# Patient Record
Sex: Female | Born: 1995 | Race: White | Hispanic: No | Marital: Single | Smoking: Current every day smoker
Health system: Southern US, Community
[De-identification: ages and names within clinical notes are randomized; demographics above are authoritative.]

## PROBLEM LIST (undated history)

## (undated) DIAGNOSIS — Z915 Personal history of self-harm: Secondary | ICD-10-CM

## (undated) DIAGNOSIS — O009 Unspecified ectopic pregnancy without intrauterine pregnancy: Secondary | ICD-10-CM

## (undated) DIAGNOSIS — I82409 Acute embolism and thrombosis of unspecified deep veins of unspecified lower extremity: Secondary | ICD-10-CM

## (undated) DIAGNOSIS — F419 Anxiety disorder, unspecified: Secondary | ICD-10-CM

## (undated) DIAGNOSIS — G43909 Migraine, unspecified, not intractable, without status migrainosus: Secondary | ICD-10-CM

## (undated) HISTORY — DX: Personal history of self-harm: Z91.5

## (undated) HISTORY — PX: NEPHROSTOMY TUBE PLACEMENT (ARMC HX): HXRAD1726

## (undated) HISTORY — DX: Migraine, unspecified, not intractable, without status migrainosus: G43.909

---
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
## 2015-01-01 ENCOUNTER — Encounter: Payer: Self-pay | Admitting: Family Medicine

## 2015-01-01 ENCOUNTER — Ambulatory Visit (INDEPENDENT_AMBULATORY_CARE_PROVIDER_SITE_OTHER): Payer: Medicaid Other | Admitting: Family Medicine

## 2015-01-01 ENCOUNTER — Telehealth: Payer: Self-pay | Admitting: Family Medicine

## 2015-01-01 VITALS — BP 108/68 | HR 112 | Temp 98.1°F | Resp 20 | Wt 107.0 lb

## 2015-01-01 DIAGNOSIS — F39 Unspecified mood [affective] disorder: Secondary | ICD-10-CM

## 2015-01-01 DIAGNOSIS — F410 Panic disorder [episodic paroxysmal anxiety] without agoraphobia: Secondary | ICD-10-CM

## 2015-01-01 DIAGNOSIS — F419 Anxiety disorder, unspecified: Secondary | ICD-10-CM | POA: Diagnosis not present

## 2015-01-01 DIAGNOSIS — F418 Other specified anxiety disorders: Secondary | ICD-10-CM

## 2015-01-01 MED ORDER — DIAZEPAM 5 MG PO TABS: 2.5000 mg | ORAL_TABLET | Freq: Two times a day (BID) | ORAL | Status: AC | PRN
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
## 2015-01-01 NOTE — Telephone Encounter (Signed)
Please review
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
## 2015-01-05 ENCOUNTER — Emergency Department
Admission: EM | Admit: 2015-01-05 | Discharge: 2015-01-05 | Disposition: A | Discharge status: Other Acute Inpatient Hospital | Payer: Medicaid Other | Attending: Emergency Medicine | Admitting: Emergency Medicine

## 2015-01-05 ENCOUNTER — Encounter: Payer: Self-pay | Admitting: Emergency Medicine

## 2015-01-05 DIAGNOSIS — F1910 Other psychoactive substance abuse, uncomplicated: Secondary | ICD-10-CM | POA: Diagnosis present

## 2015-01-05 DIAGNOSIS — Z79899 Other long term (current) drug therapy: Secondary | ICD-10-CM | POA: Diagnosis not present

## 2015-01-05 DIAGNOSIS — F418 Other specified anxiety disorders: Secondary | ICD-10-CM | POA: Diagnosis present

## 2015-01-05 DIAGNOSIS — F4325 Adjustment disorder with mixed disturbance of emotions and conduct: Secondary | ICD-10-CM

## 2015-01-05 DIAGNOSIS — F17210 Nicotine dependence, cigarettes, uncomplicated: Secondary | ICD-10-CM | POA: Diagnosis not present

## 2015-01-05 DIAGNOSIS — R45851 Suicidal ideations: Secondary | ICD-10-CM

## 2015-01-05 DIAGNOSIS — F329 Major depressive disorder, single episode, unspecified: Secondary | ICD-10-CM | POA: Insufficient documentation

## 2015-01-05 DIAGNOSIS — F332 Major depressive disorder, recurrent severe without psychotic features: Secondary | ICD-10-CM | POA: Diagnosis present

## 2015-01-05 DIAGNOSIS — F32A Depression, unspecified: Secondary | ICD-10-CM

## 2015-01-05 LAB — COMPREHENSIVE METABOLIC PANEL
ALK PHOS: 53 U/L (ref 38–126)
ALT: 16 U/L (ref 14–54)
ANION GAP: 6 (ref 5–15)
AST: 19 U/L (ref 15–41)
Albumin: 4.6 g/dL (ref 3.5–5.0)
BUN: 19 mg/dL (ref 6–20)
CALCIUM: 9.1 mg/dL (ref 8.9–10.3)
CO2: 26 mmol/L (ref 22–32)
Chloride: 108 mmol/L (ref 101–111)
Creatinine, Ser: 0.81 mg/dL (ref 0.44–1.00)
Glucose, Bld: 92 mg/dL (ref 65–99)
Potassium: 3.6 mmol/L (ref 3.5–5.1)
SODIUM: 140 mmol/L (ref 135–145)
Total Bilirubin: 0.5 mg/dL (ref 0.3–1.2)
Total Protein: 7.3 g/dL (ref 6.5–8.1)

## 2015-01-05 LAB — SALICYLATE LEVEL

## 2015-01-05 LAB — ACETAMINOPHEN LEVEL

## 2015-01-05 LAB — CBC
HCT: 43.4 % (ref 35.0–47.0)
HEMOGLOBIN: 14.6 g/dL (ref 12.0–16.0)
MCH: 28.9 pg (ref 26.0–34.0)
MCHC: 33.5 g/dL (ref 32.0–36.0)
MCV: 86.2 fL (ref 80.0–100.0)
PLATELETS: 227 10*3/uL (ref 150–440)
RBC: 5.04 MIL/uL (ref 3.80–5.20)
RDW: 13.8 % (ref 11.5–14.5)
WBC: 7 10*3/uL (ref 3.6–11.0)

## 2015-01-05 LAB — ETHANOL

## 2015-01-05 NOTE — ED Notes (Signed)
BEHAVIORAL HEALTH ROUNDING Patient sleeping: No. Patient alert and oriented: yes Behavior appropriate: Yes.  ; If no, describe:  Nutrition and fluids offered: Yes  Toileting and hygiene offered: Yes  Sitter present: no Law enforcement present: Yes  

## 2015-01-05 NOTE — ED Notes (Signed)
Patient assigned to appropriate care area. Patient oriented to unit/care area: Informed that, for their safety, care areas are designed for safety and monitored by security cameras at all times; and visiting hours explained to patient. Patient verbalizes understanding, and verbal contract for safety obtained. 

## 2015-01-05 NOTE — Discharge Instructions (Signed)
Suicidal Feelings: How to Help Yourself  Suicide is the taking of one's own life. If you feel as though life is getting too tough to handle and are thinking about suicide, get help right away. To get help:  · Call your local emergency services (911 in the [place]  · Call a suicide hotline to speak with a trained counselor who understands how you are feeling. The following is a list of suicide hotlines in the [place]. For a list of hotlines in [place], visit www.suicide.org/hotlines/international/[place]-suicide-hotlines.html.  ·  1-800-273-TALK (1-800-273-8255).  ·  1-800-SUICIDE (1-800-784-2433).  ·  1-888-628-9454. This is a hotline for Spanish speakers.  ·  1-800-799-4TTY (1-800-799-4889). This is a hotline for TTY users.  ·  1-866-4-U-TREVOR (1-866-488-7386). This is a hotline for lesbian, gay, bisexual, transgender, or questioning youth.  · Contact a crisis center or a local suicide prevention center. To find a crisis center or suicide prevention center:  · Call your local hospital, clinic, community service organization, mental health center, social service provider, or health department. Ask for assistance in connecting to a crisis center.  · Visit www.suicidepreventionlifeline.org/getinvolved/locator for a list of crisis centers in the [place], or visit www.suicideprevention.[place]/thinking-about-suicide/find-a-crisis-centre for a list of centers in [place].  · Visit the following websites:  ·  National Suicide Prevention Lifeline: www.suicidepreventionlifeline.org  ·  Hopeline: www.hopeline.com  ·  American Foundation for Suicide Prevention: www.afsp.org  ·  The Trevor Project (for lesbian, gay, bisexual, transgender, or questioning youth): www.thetrevorproject.org  HOW CAN I HELP MYSELF FEEL BETTER?  · Promise yourself that you will not do anything drastic when you have suicidal feelings. Remember, there is hope. Many people have gotten through suicidal thoughts and feelings, and you will, too. You may  have gotten through them before, and this proves that you can get through them again.  · Let family, friends, teachers, or counselors know how you are feeling. Try not to isolate yourself from those who care about you. Remember, they will want to help you. Talk with someone every day, even if you do not feel sociable. Face-to-face conversation is best.  · Call a mental health professional and see one regularly.  · Visit your primary health care provider every year.  · Eat a well-balanced diet, and space your meals so you eat regularly.  · Get plenty of rest.  · Avoid alcohol and drugs, and remove them from your home. They will only make you feel worse.  · If you are thinking of taking a lot of medicine, give your medicine to someone who can give it to you one day at a time. If you are on antidepressants and are concerned you will overdose, let your health care provider know so he or she can give you safer medicines. Ask your mental health professional about the possible side effects of any medicines you are taking.  · Remove weapons, poisons, knives, and anything else that could harm you from your home.  · Try to stick to routines. Follow a schedule every day. Put self-care on your schedule.  · Make a list of realistic goals, and cross them off when you achieve them. Accomplishments give a sense of worth.  · Wait until you are feeling better before doing the things you find difficult or unpleasant.  · Exercise if you are able. You will feel better if you exercise for even a half hour each day.  · Go out in the sun or into nature. This will help you recover from depression faster. If you have a favorite place to   walk, go there.  · Do the things that have always given you pleasure. Play your favorite music, read a good book, paint a picture, play your favorite instrument, or do anything else that takes your mind off your depression if it is safe to do.  · Keep your living space well lit.  · When you are feeling well,  write yourself a letter about tips and support that you can read when you are not feeling well.  · Remember that life's difficulties can be sorted out with help. Conditions can be treated. You can work on thoughts and strategies that serve you well.     This information is not intended to replace advice given to you by your health care provider. Make sure you discuss any questions you have with your health care provider.     Document Released: 07/22/2002 Document Revised: 02/05/2014 Document Reviewed: 05/12/2013  Elsevier Interactive Patient Education ©2016 [employer]  Major Depressive Disorder  Major depressive disorder is a mental illness. It also may be called clinical depression or unipolar depression. Major depressive disorder usually causes feelings of sadness, hopelessness, or helplessness. Some people with this disorder do not feel particularly sad but lose interest in doing things they used to enjoy (anhedonia). Major depressive disorder also can cause physical symptoms. It can interfere with work, school, relationships, and other normal everyday activities. The disorder varies in severity but is longer lasting and more serious than the sadness we all feel from time to time in our lives.  Major depressive disorder often is triggered by stressful life events or major life changes. Examples of these triggers include divorce, loss of your job or home, a move, and the death of a family member or close friend. Sometimes this disorder occurs for no obvious reason at all. People who have family members with major depressive disorder or bipolar disorder are at higher risk for developing this disorder, with or without life stressors. Major depressive disorder can occur at any age. It may occur just once in your life (single episode major depressive disorder). It may occur multiple times (recurrent major depressive disorder).  SYMPTOMS  People with major depressive disorder have either anhedonia or depressed mood  on nearly a daily basis for at least 2 weeks or longer. Symptoms of depressed mood include:  · Feelings of sadness (blue or down in the dumps) or emptiness.  · Feelings of hopelessness or helplessness.  · Tearfulness or episodes of crying (may be observed by others).  · Irritability (children and adolescents).  In addition to depressed mood or anhedonia or both, people with this disorder have at least four of the following symptoms:  · Difficulty sleeping or sleeping too much.    · Significant change (increase or decrease) in appetite or weight.    · Lack of energy or motivation.  · Feelings of guilt and worthlessness.    · Difficulty concentrating, remembering, or making decisions.  · Unusually slow movement (psychomotor retardation) or restlessness (as observed by others).    · Recurrent wishes for death, recurrent thoughts of self-harm (suicide), or a suicide attempt.  People with major depressive disorder commonly have persistent negative thoughts about themselves, other people, and the world. People with severe major depressive disorder may experience distorted beliefs or perceptions about the world (psychotic delusions). They also may see or hear things that are not real (psychotic hallucinations).  DIAGNOSIS  Major depressive disorder is diagnosed through an assessment by your health care provider. Your health care provider will ask about aspects of   your daily life, such as mood, sleep, and appetite, to see if you have the diagnostic symptoms of major depressive disorder. Your health care provider may ask about your medical history and use of alcohol or drugs, including prescription medicines. Your health care provider also may do a physical exam and blood work. This is because certain medical conditions and the use of certain substances can cause major depressive disorder-like symptoms (secondary depression). Your health care provider also may refer you to a mental health specialist for further evaluation  and treatment.  TREATMENT  It is important to recognize the symptoms of major depressive disorder and seek treatment. The following treatments can be prescribed for this disorder:    · Medicine. Antidepressant medicines usually are prescribed. Antidepressant medicines are thought to correct chemical imbalances in the brain that are commonly associated with major depressive disorder. Other types of medicine may be added if the symptoms do not respond to antidepressant medicines alone or if psychotic delusions or hallucinations occur.  · Talk therapy. Talk therapy can be helpful in treating major depressive disorder by providing support, education, and guidance. Certain types of talk therapy also can help with negative thinking (cognitive behavioral therapy) and with relationship issues that trigger this disorder (interpersonal therapy).  A mental health specialist can help determine which treatment is best for you. Most people with major depressive disorder do well with a combination of medicine and talk therapy. Treatments involving electrical stimulation of the brain can be used in situations with extremely severe symptoms or when medicine and talk therapy do not work over time. These treatments include electroconvulsive therapy, transcranial magnetic stimulation, and vagal nerve stimulation.     This information is not intended to replace advice given to you by your health care provider. Make sure you discuss any questions you have with your health care provider.     Document Released: 05/12/2012 Document Revised: 02/05/2014 Document Reviewed: 05/12/2012  Elsevier Interactive Patient Education ©2016 [employer] Inc[employer]

## 2015-01-05 NOTE — ED Provider Notes (Addendum)
[facility], [facility] Emergency Department Provider Note  ____________________________________________  Time seen: Approximately 220 PM  I have reviewed the triage vital signs and the nursing notes.   HISTORY  Chief Complaint Suicidal    HPI Christine Terry is a 19 y.o. female with a history of anxiety and migraine who is presenting today with thoughts of suicide. She said that she just lost her mother unexpectedly on December 2 and has been taking about stabbing herself ever since. She has not actually taken action to hurt herself with any action or ingestion.   Past Medical History  Diagnosis Date  . Anxiety   . Migraine   . History of suicide attempt 2013    hospitalized with suicide attempt on Abilify in 2013  . Acute DVT (deep venous thrombosis) (HCC)   . Ectopic pregnancy     Patient Active Problem List   Diagnosis Date Noted  . Panic disorder 01/01/2015  . Pregnancy of unknown anatomic location 10/14/2014  . Amenorrhea 09/07/2014  . Anxiety with somatic features 09/07/2014  . Back pain 09/07/2014  . Contraception 09/07/2014  . Dysmenorrhea 09/07/2014  . Fatigue 09/07/2014  . High risk sexual behavior 09/07/2014  . Headache, migraine 09/07/2014  . Allergic rhinitis 09/07/2014  . Post partum depression 09/07/2014  . Deep vein thrombosis of upper extremity (HCC) 10/12/2013  . Recurrent major depression-severe (HCC) 02/17/2011  . Polysubstance abuse 02/17/2011  . Episodic mood disorder (HCC) 08/29/2009    Past Surgical History  Procedure Laterality Date  . Nephrostomy tube placement ([facility] hx)      removed since then  . Dilation and evacuation N/A 10/14/2014    Procedure: DILATATION AND EVACUATION;  Surgeon: Vena AustriaAndreas Staebler, MD;  Location: [facility] ORS;  Service: Gynecology;  Laterality: N/A;  . Laparoscopy Bilateral 10/14/2014    Procedure: LAPAROSCOPY DIAGNOSTIC;  Surgeon: Vena AustriaAndreas Staebler, MD;  Location: [facility] ORS;  Service: Gynecology;   Laterality: Bilateral;  . Ovarian cyst removal Left 10/14/2014    Procedure: OVARIAN CYSTECTOMY;  Surgeon: Vena AustriaAndreas Staebler, MD;  Location: [facility] ORS;  Service: Gynecology;  Laterality: Left;    Current Outpatient Rx  Name  Route  Sig  Dispense  Refill  . diazepam (VALIUM) 5 MG tablet   Oral   Take 0.5-1 tablets (2.5-5 mg total) by mouth every 12 (twelve) hours as needed for anxiety.   6 tablet   1   . ibuprofen (ADVIL,MOTRIN) 600 MG tablet   Oral   Take 1 tablet (600 mg total) by mouth every 6 (six) hours as needed for fever or headache.   30 tablet   0   . sertraline (ZOLOFT) 100 MG tablet      200 mg daily.           Allergies Sulfa antibiotics; Morpholine salicylate; and Suprax   Family History  Problem Relation Age of Onset  . Asthma Mother   . Bipolar disorder Mother   . Depression Mother   . Depression Brother   . Emphysema Maternal Grandmother   . Bipolar disorder Maternal Grandmother   . Alcohol abuse Maternal Grandfather   . Cirrhosis Maternal Grandfather     Social History Social History  Substance Use Topics  . Smoking status: Current Every Day Smoker -- 1.00 packs/day for 1 years    Types: Cigarettes  . Smokeless tobacco: None  . Alcohol Use: No    Review of Systems Constitutional: No fever/chills Eyes: No visual changes. ENT: No sore throat. Cardiovascular: Denies chest pain. Respiratory:  Denies shortness of breath. Gastrointestinal: No abdominal pain.  No nausea, no vomiting.  No diarrhea.  No constipation. Genitourinary: Negative for dysuria. Musculoskeletal: Negative for back pain. Skin: Negative for rash. Neurological: Negative for headaches, focal weakness or numbness.  10-point ROS otherwise negative.  ____________________________________________   PHYSICAL EXAM:  VITAL SIGNS: ED Triage Vitals  Enc Vitals Group     BP --      Pulse --      Resp --      Temp --      Temp src --      SpO2 --      Weight --      Height --       Head Cir --      Peak Flow --      Pain Score 01/05/15 1043 0     Pain Loc --      Pain Edu? --      Excl. in GC? --     Constitutional: Alert and oriented. Well appearing and in no acute distress. Eyes: Conjunctivae are normal. PERRL. EOMI. Head: Atraumatic. Nose: No congestion/rhinnorhea. Mouth/Throat: Mucous membranes are moist.  Oropharynx non-erythematous. Neck: No stridor.   Cardiovascular: Normal rate, regular rhythm. Grossly normal heart sounds.  Good peripheral circulation. Respiratory: Normal respiratory effort.  No retractions. Lungs CTAB. Gastrointestinal: Soft and nontender. No distention. No abdominal bruits. No CVA tenderness. Musculoskeletal: No lower extremity tenderness nor edema.  No joint effusions. Neurologic:  Normal speech and language. No gross focal neurologic deficits are appreciated. No gait instability. Skin:  Skin is warm, dry and intact. No rash noted. Psychiatric: Depressed affect. Speech and behavior are normal.  ____________________________________________   LABS (all labs ordered are listed, but only abnormal results are displayed)  Labs Reviewed  ACETAMINOPHEN LEVEL - Abnormal; Notable for the following:    Acetaminophen (Tylenol), Serum <10 (*)    All other components within normal limits  COMPREHENSIVE METABOLIC PANEL  ETHANOL  SALICYLATE LEVEL  CBC  URINE DRUG SCREEN, QUALITATIVE ([facility] ONLY)  POC URINE PREG, ED   ____________________________________________  EKG   ____________________________________________  RADIOLOGY   ____________________________________________   PROCEDURES    ____________________________________________   INITIAL IMPRESSION / ASSESSMENT AND PLAN / ED COURSE  Pertinent labs & imaging results that were available during my care of the patient were reviewed by me and considered in my medical decision making (see chart for details).  Patient aware of need for commitment to the emergency  department as well as psychiatric evaluation.  ----------------------------------------- 4:05 PM on 01/05/2015 -----------------------------------------  Patient seen and evaluated by Dr. Toni Amend. The patient does no longer suicidal. Suspicion that there was some altercation over legal dispute between her and her family by the psychiatry service. Dr. Toni Amend I ordered the patient and rescinded the involuntary commitment order. The patient will follow-up with her known psychiatrist [facility]. ____________________________________________   FINAL CLINICAL IMPRESSION(S) / ED DIAGNOSES  Depression. Suicidal ideation.    Myrna Blazer, MD 01/05/15 1453  Myrna Blazer, MD 01/05/15 [phone]

## 2015-01-05 NOTE — Consult Note (Signed)
[facility] Consult   Reason for Consult:  Consult for this 19 year old woman with a history of behavior and substance abuse problems who came into the emergency room saying she was having hallucinations and suicidal ideation Referring Physician:  Schaevitz Patient Identification: Christine Terry MRN:  [id number] Principal Diagnosis: Adjustment disorder with mixed disturbance of emotions and conduct Diagnosis:   Patient Active Problem List   Diagnosis Date Noted  . Adjustment disorder with mixed disturbance of emotions and conduct [F43.25] 01/05/2015  . Panic disorder [F41.0] 01/01/2015  . Pregnancy of unknown anatomic location [Z33.1] 10/14/2014  . Amenorrhea [N91.2] 09/07/2014  . Anxiety with somatic features [F41.9] 09/07/2014  . Back pain [M54.9] 09/07/2014  . Contraception [Z30.9] 09/07/2014  . Dysmenorrhea [N94.6] 09/07/2014  . Fatigue [R53.83] 09/07/2014  . High risk sexual behavior [Z72.51] 09/07/2014  . Headache, migraine [G43.909] 09/07/2014  . Allergic rhinitis [J30.9] 09/07/2014  . Post partum depression [F53] 09/07/2014  . Deep vein thrombosis of upper extremity ([place]) [I82.629] 10/12/2013  . Recurrent major depression-severe ([place]) [F33.2] 02/17/2011  . Polysubstance abuse [F19.10] 02/17/2011  . Episodic mood disorder ([place]) [F39] 08/29/2009    Total Time spent with patient: 1 hour  Subjective:   Christine Terry is a 19 y.o. female patient admitted with "I just lost my mother".  HPI:  Information from the patient and the chart. Patient interviewed. Chart reviewed including current emergency room notes, old chart including old mental health evaluations. Case reviewed with ER psychiatry staff at [facility]. This 19 year old woman had her self brought in by EMS. She claims that her mother died on 01/19/2023. She says that since then she has been "hearing things and seeing things". When asked to describe these she will just say that she has bad thoughts  but doesn't actually describe any hallucinations. She says she hasn't slept well for several days. She says she hasn't eaten anything in several days as well. She has been taking her prescribed outpatient psychiatric medicine but doesn't think it's working well enough. She says she's been staying with her cousin she gives mixed information about the situation at home at one point saying that her family wants to have her violated on her bail and and sent back to jail and on another point saying that they want to take her out to the state psychiatric hospital seeking treatment. Patient admits that she's been taking some nonprescribed narcotics but says she only takes 1 hydrocodone a day. Denies that she's been abusing drugs or alcohol otherwise. She had made statements about being suicidal but when I ask her several time she directly denied suicidal ideation. Christine Terry that she has positive things in her life to look forward to and thing she enjoys and has no wish to harm herself. Has not been behaving as though she were having auditory or visual hallucinations. Has not been agitated since being here in the emergency room. She talked at one point about how she had been trying to kill her self but the cut she sews on her arm are barely visible and look more like just superficial scratches.  Social history: Patient just got out of jail a few days ago. She was locked up for filing a false police report. Charges evidently still pending. Patient is claiming that her mother died on 01-19-2023. I do not want to be disrespectful and have no definite proof one way or the other but I do note that there is documentation from December 3 of conversation  she was having with providers in which nothing at all was mentioned about this. Patient does not work. She has a 18-year-old child whom she has no custody and doesn't get to see.  Substance abuse history: History of abuse mostly of benzodiazepines somewhat also of alcohol but  probably narcotics as well. Drug-related charges as well.  Medical history: Denies that she has any active medical problems at all.  Past Psychiatric History: Patient has been diagnosed with depression and bipolar disorder in the past. Has been on a large number of medicines. Has had product where admissions to psychiatric hospitals. Currently she goes to [facility] and sees a psychiatrist there and is taking Seroquel Zoloft Klonopin and Valium. She claims that she has tried to kill her self in the past and does look like they're of been hospitalizations for possible suicide attempts.  Risk to Self: Is patient at risk for suicide?: Yes Risk to Others:   Prior Inpatient Therapy:   Prior Outpatient Therapy:    Past Medical History:  Past Medical History  Diagnosis Date  . Anxiety   . Migraine   . History of suicide attempt 2013    hospitalized with suicide attempt on Abilify in 2013  . Acute DVT (deep venous thrombosis) ([place])   . Ectopic pregnancy     Past Surgical History  Procedure Laterality Date  . Nephrostomy tube placement ([facility] hx)      removed since then  . Dilation and evacuation N/A 10/14/2014    Procedure: DILATATION AND EVACUATION;  Surgeon: Malachy Mood, MD;  Location: [facility] ORS;  Service: Gynecology;  Laterality: N/A;  . Laparoscopy Bilateral 10/14/2014    Procedure: LAPAROSCOPY DIAGNOSTIC;  Surgeon: Malachy Mood, MD;  Location: [facility] ORS;  Service: Gynecology;  Laterality: Bilateral;  . Ovarian cyst removal Left 10/14/2014    Procedure: OVARIAN CYSTECTOMY;  Surgeon: Malachy Mood, MD;  Location: [facility] ORS;  Service: Gynecology;  Laterality: Left;   Family History:  Family History  Problem Relation Age of Onset  . Asthma Mother   . Bipolar disorder Mother   . Depression Mother   . Depression Brother   . Emphysema Maternal Grandmother   . Bipolar disorder Maternal Grandmother   . Alcohol abuse Maternal Grandfather   . Cirrhosis Maternal Grandfather    Family  Psychiatric  History: Positive family history for mental illness she says many people in her family have bipolar disorder schizophrenia substance abuse problems. Social History:  History  Alcohol Use No     History  Drug Use No    Social History   Social History  . Marital Status: Single    Spouse Name: N/A  . Number of Children: N/A  . Years of Education: N/A   Occupational History  .      Works part time at [employer] Topics  . Smoking status: Current Every Day Smoker -- 1.00 packs/day for 1 years    Types: Cigarettes  . Smokeless tobacco: None  . Alcohol Use: No  . Drug Use: No  . Sexual Activity: Yes    Birth Control/ Protection: None   Other Topics Concern  . None   Social History Narrative   Additional Social History:                          Allergies:   Allergies  Allergen Reactions  . Sulfa Antibiotics Anaphylaxis  . Morpholine Salicylate Rash  . Suprax  [Cefixime]  Rash    Labs:  Results for orders placed or performed during the hospital encounter of 01/05/15 (from the past 48 hour(s))  Comprehensive metabolic panel     Status: None   Collection Time: 01/05/15 10:54 AM  Result Value Ref Range   Sodium 140 135 - 145 mmol/L   Potassium 3.6 3.5 - 5.1 mmol/L   Chloride 108 101 - 111 mmol/L   CO2 26 22 - 32 mmol/L   Glucose, Bld 92 65 - 99 mg/dL   BUN 19 6 - 20 mg/dL   Creatinine, Ser 0.81 0.44 - 1.00 mg/dL   Calcium 9.1 8.9 - 10.3 mg/dL   Total Protein 7.3 6.5 - 8.1 g/dL   Albumin 4.6 3.5 - 5.0 g/dL   AST 19 15 - 41 U/L   ALT 16 14 - 54 U/L   Alkaline Phosphatase 53 38 - 126 U/L   Total Bilirubin 0.5 0.3 - 1.2 mg/dL   GFR calc non Af Amer >60 >60 mL/min   GFR calc Af Amer >60 >60 mL/min    Comment: (NOTE) The eGFR has been calculated using the CKD EPI equation. This calculation has not been validated in all clinical situations. eGFR's persistently <60 mL/min signify possible Chronic Kidney Disease.     Anion gap 6 5 - 15  Ethanol (ETOH)     Status: None   Collection Time: 01/05/15 10:54 AM  Result Value Ref Range   Alcohol, Ethyl (B) <5 <5 mg/dL    Comment:        LOWEST DETECTABLE LIMIT FOR SERUM ALCOHOL IS 5 mg/dL FOR MEDICAL PURPOSES ONLY   Salicylate level     Status: None   Collection Time: 01/05/15 10:54 AM  Result Value Ref Range   Salicylate Lvl <9.5 2.8 - 30.0 mg/dL  Acetaminophen level     Status: Abnormal   Collection Time: 01/05/15 10:54 AM  Result Value Ref Range   Acetaminophen (Tylenol), Serum <10 (L) 10 - 30 ug/mL    Comment:        THERAPEUTIC CONCENTRATIONS VARY SIGNIFICANTLY. A RANGE OF 10-30 ug/mL MAY BE AN EFFECTIVE CONCENTRATION FOR MANY PATIENTS. HOWEVER, SOME ARE BEST TREATED AT CONCENTRATIONS OUTSIDE THIS RANGE. ACETAMINOPHEN CONCENTRATIONS >150 ug/mL AT 4 HOURS AFTER INGESTION AND >50 ug/mL AT 12 HOURS AFTER INGESTION ARE OFTEN ASSOCIATED WITH TOXIC REACTIONS.   CBC     Status: None   Collection Time: 01/05/15 10:54 AM  Result Value Ref Range   WBC 7.0 3.6 - 11.0 K/uL   RBC 5.04 3.80 - 5.20 MIL/uL   Hemoglobin 14.6 12.0 - 16.0 g/dL   HCT 43.4 35.0 - 47.0 %   MCV 86.2 80.0 - 100.0 fL   MCH 28.9 26.0 - 34.0 pg   MCHC 33.5 32.0 - 36.0 g/dL   RDW 13.8 11.5 - 14.5 %   Platelets 227 150 - 440 K/uL    No current facility-administered medications for this encounter.   Current Outpatient Prescriptions  Medication Sig Dispense Refill  . diazepam (VALIUM) 5 MG tablet Take 0.5-1 tablets (2.5-5 mg total) by mouth every 12 (twelve) hours as needed for anxiety. 6 tablet 1  . ibuprofen (ADVIL,MOTRIN) 600 MG tablet Take 1 tablet (600 mg total) by mouth every 6 (six) hours as needed for fever or headache. 30 tablet 0  . sertraline (ZOLOFT) 100 MG tablet 200 mg daily.      Musculoskeletal: Strength & Muscle Tone: within normal limits Gait & Station: normal Patient leans: N/A  Psychiatric Specialty Exam: Review of Systems  Eyes: Negative.    Respiratory: Negative.   Cardiovascular: Negative.   Gastrointestinal: Negative.   Musculoskeletal: Negative.   Skin: Negative.   Neurological: Positive for weakness and headaches.  Psychiatric/Behavioral: Positive for depression, hallucinations, memory loss and substance abuse. Negative for suicidal ideas. The patient is nervous/anxious and has insomnia.     Last menstrual period 12/13/2014.There is no weight on file to calculate BMI.  General Appearance: Disheveled  Eye Contact::  Minimal  Speech:  Slow  Volume:  Decreased  Mood:  Dysphoric  Affect:  Depressed  Thought Process:  Goal Directed  Orientation:  Full (Time, Place, and Person)  Thought Content:  Hallucinations: Auditory  Suicidal Thoughts:  No  Homicidal Thoughts:  No  Memory:  Immediate;   Good Recent;   Good Remote;   Fair  Judgement:  Impaired  Insight:  Shallow  Psychomotor Activity:  Decreased  Concentration:  Fair  Recall:  [employer] of Knowledge:Fair  Language: Fair  Akathisia:  No  Handed:  Right  AIMS (if indicated):     Assets:  Communication Skills Desire for Improvement Housing Physical Health Resilience  ADL's:  Intact  Cognition: WNL  Sleep:      Treatment Plan Summary: Plan This 19 year old woman came into the emergency room claiming to be having hallucinations. She does not appear to be psychotic to my evaluation and is not able to describe anything that really sounds like a hallucination. She had made suicidal statements to other providers earlier but when I interviewed her she was very clear that she had no wish to die and no plan to harm herself. I'm not sure exactly what is going on with her. She claims that her mother died on the second but we have at least some evidence that that might not be true. In any case at this point she does not appear to need psychiatric hospitalization. Supportive counseling completed and patient was encouraged to not use any nonprescribed medicine but to go  to [facility] and see her outpatient providers and discussed changes with medicines with them. Encouraged to eat well get normal sleep stay around supportive family. Case reviewed with emergency room doctor. IVC discontinued. She can be released from the emergency room.  Disposition: Patient does not meet criteria for psychiatric inpatient admission. Supportive therapy provided about ongoing stressors.  John Clapacs 01/05/2015 4:31 PM
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
## 2015-01-05 NOTE — ED Notes (Signed)
Reports hearing voices, cutting wrists and thinking about suicide.

## 2015-01-05 NOTE — ED Notes (Addendum)
Blood was drawn from the patient in room triage 3. Patient and her belongings were walked to room 21. Her belongings were then taken to the locked [facility] for safe keeping.

## 2015-01-05 NOTE — ED Notes (Signed)

## 2015-01-06 ENCOUNTER — Emergency Department
Admission: EM | Admit: 2015-01-06 | Discharge: 2015-01-06 | Disposition: A | Discharge status: Home / Self Care | Payer: Medicaid Other | Attending: Emergency Medicine | Admitting: Emergency Medicine

## 2015-01-06 ENCOUNTER — Encounter: Payer: Self-pay | Admitting: Emergency Medicine

## 2015-01-06 DIAGNOSIS — F17210 Nicotine dependence, cigarettes, uncomplicated: Secondary | ICD-10-CM | POA: Diagnosis not present

## 2015-01-06 DIAGNOSIS — Z79899 Other long term (current) drug therapy: Secondary | ICD-10-CM | POA: Insufficient documentation

## 2015-01-06 DIAGNOSIS — Z760 Encounter for issue of repeat prescription: Secondary | ICD-10-CM | POA: Diagnosis present

## 2015-01-06 NOTE — Discharge Instructions (Signed)
Return to the emergency room at any time if you feel significant weight worse including thoughts of hurting yourself or others, tremors, shakes, hallucinations or you feel worse in any way. Follow closely with your primary care doctor.

## 2015-01-06 NOTE — ED Provider Notes (Signed)
[facility] Emergency Department Provider Note  ____________________________________________   I have reviewed the triage vital signs and the nursing notes.   HISTORY  Chief Complaint Medication Refill    HPI Marcy SirenCaitlin R Catania is a 19 y.o. female who according to psychiatry has a history of abusing benzodiazepines is here because she wants a prescription for Valium. She has no SI or HI no other complaints. She has all of her other medications at home. She has had no seizure and no history of withdrawal. She is not tremulous or tachycardic she states  Past Medical History  Diagnosis Date  . Anxiety   . Migraine   . History of suicide attempt 2013    hospitalized with suicide attempt on Abilify in 2013  . Acute DVT (deep venous thrombosis) ([facility])   . Ectopic pregnancy     Patient Active Problem List   Diagnosis Date Noted  . Adjustment disorder with mixed disturbance of emotions and conduct 01/05/2015  . Panic disorder 01/01/2015  . Pregnancy of unknown anatomic location 10/14/2014  . Amenorrhea 09/07/2014  . Anxiety with somatic features 09/07/2014  . Back pain 09/07/2014  . Contraception 09/07/2014  . Dysmenorrhea 09/07/2014  . Fatigue 09/07/2014  . High risk sexual behavior 09/07/2014  . Headache, migraine 09/07/2014  . Allergic rhinitis 09/07/2014  . Post partum depression 09/07/2014  . Deep vein thrombosis of upper extremity ([facility]) 10/12/2013  . Recurrent major depression-severe ([facility]) 02/17/2011  . Polysubstance abuse 02/17/2011  . Episodic mood disorder ([facility]) 08/29/2009    Past Surgical History  Procedure Laterality Date  . Nephrostomy tube placement ([facility] hx)      removed since then  . Dilation and evacuation N/A 10/14/2014    Procedure: DILATATION AND EVACUATION;  Surgeon: Vena AustriaAndreas Staebler, MD;  Location: [facility] ORS;  Service: Gynecology;  Laterality: N/A;  . Laparoscopy Bilateral 10/14/2014    Procedure: LAPAROSCOPY DIAGNOSTIC;   Surgeon: Vena AustriaAndreas Staebler, MD;  Location: [facility] ORS;  Service: Gynecology;  Laterality: Bilateral;  . Ovarian cyst removal Left 10/14/2014    Procedure: OVARIAN CYSTECTOMY;  Surgeon: Vena AustriaAndreas Staebler, MD;  Location: [facility] ORS;  Service: Gynecology;  Laterality: Left;    Current Outpatient Rx  Name  Route  Sig  Dispense  Refill  . clonazePAM (KLONOPIN) 1 MG tablet   Oral   Take 1 mg by mouth 2 (two) times daily.         . mirtazapine (REMERON) 15 MG tablet   Oral   Take 15 mg by mouth at bedtime.         Marland Kitchen. QUEtiapine (SEROQUEL) 300 MG tablet   Oral   Take 300 mg by mouth at bedtime.         . diazepam (VALIUM) 5 MG tablet   Oral   Take 0.5-1 tablets (2.5-5 mg total) by mouth every 12 (twelve) hours as needed for anxiety.   6 tablet   1   . ibuprofen (ADVIL,MOTRIN) 600 MG tablet   Oral   Take 1 tablet (600 mg total) by mouth every 6 (six) hours as needed for fever or headache.   30 tablet   0   . sertraline (ZOLOFT) 100 MG tablet      200 mg daily.           Allergies Sulfa antibiotics; Morpholine salicylate; and Suprax   Family History  Problem Relation Age of Onset  . Asthma Mother   . Bipolar disorder Mother   . Depression Mother   .  Depression Brother   . Emphysema Maternal Grandmother   . Bipolar disorder Maternal Grandmother   . Alcohol abuse Maternal Grandfather   . Cirrhosis Maternal Grandfather     Social History Social History  Substance Use Topics  . Smoking status: Current Every Day Smoker -- 1.00 packs/day for 1 years    Types: Cigarettes  . Smokeless tobacco: None  . Alcohol Use: No    Review of Systems Constitutional: No fever/chills Eyes: No visual changes. ENT: No sore throat. No stiff neck no neck pain Cardiovascular: Denies chest pain. Respiratory: Denies shortness of breath. Gastrointestinal:   no vomiting.  No diarrhea.  No constipation. Genitourinary: Negative for dysuria. Musculoskeletal: Negative lower extremity  swelling Skin: Negative for rash. Neurological: Negative for headaches, focal weakness or numbness. 10-point ROS otherwise negative.  ____________________________________________   PHYSICAL EXAM:  VITAL SIGNS: ED Triage Vitals  Enc Vitals Group     BP 01/06/15 1249 102/63 mmHg     Pulse Rate 01/06/15 1249 101     Resp 01/06/15 1249 18     Temp 01/06/15 1249 98.2 F (36.8 C)     Temp Source 01/06/15 1249 Oral     SpO2 01/06/15 1249 100 %     Weight 01/06/15 1249 110 lb (49.896 kg)     Height 01/06/15 1249  (1.626 m)     Head Cir --      Peak Flow --      Pain Score --      Pain Loc --      Pain Edu? --      Excl. in GC? --     Constitutional: Alert and oriented. Well appearing and in no acute distress. Eyes: Conjunctivae are normal. PERRL. EOMI. Head: Atraumatic. Nose: No congestion/rhinnorhea. Mouth/Throat: Mucous membranes are moist.  Oropharynx non-erythematous. Neck: No stridor.   Nontender with no meningismus Cardiovascular: Normal rate, regular rhythm. Grossly normal heart sounds.  Good peripheral circulation. Respiratory: Normal respiratory effort.  No retractions. Lungs CTAB. Abdominal: Soft and nontender. No distention. No guarding no rebound Back:  There is no focal tenderness or step off there is no midline tenderness there are no lesions noted. there is no CVA tenderness Musculoskeletal: No lower extremity tenderness. No joint effusions, no DVT signs strong distal pulses no edema Neurologic:  Normal speech and language. No gross focal neurologic deficits are appreciated.  Skin:  Skin is warm, dry and intact. No rash noted. Psychiatric: Mood and affect are normal. Speech and behavior are normal.  ____________________________________________   LABS (all labs ordered are listed, but only abnormal results are displayed)  Labs Reviewed - No data to display ____________________________________________  EKG  I personally interpreted any EKGs ordered by  me or triage _________________________________________  RADIOLOGY  I reviewed any imaging ordered by me or triage that were performed during my shift ____________________________________________   PROCEDURES  Procedure(s) performed: None  Critical Care performed: None  ____________________________________________   INITIAL IMPRESSION / ASSESSMENT AND PLAN / ED COURSE  Pertinent labs & imaging results that were available during my care of the patient were reviewed by me and considered in my medical decision making (see chart for details).   very well-appearing patient known to psychiatry. I did discuss with Dr. Lorinda Creed, psychiatrist who knows her very well. He feels the patient has a history of abusing benzodiazepines and does not recommend giving them to her at this time. Her heart rate at this time is 89. I do not see any evidence  of acute withdrawal. Patient states she is only actually been on 6 medications. There is some question as to whether she has been arrested for stealing benzodiazepines. She has no SI or HI at this time, we will discharge her with her family and return precautions and given and understood. She'll follow up with her doctor tomorrow ____________________________________________   FINAL CLINICAL IMPRESSION(S) / ED DIAGNOSES  Final diagnoses:  None     Jeanmarie Plant, MD 01/06/15 1452
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
## 2015-01-06 NOTE — ED Notes (Signed)
Spoke with family members.  Family expressing concern about patient getting refills of medications. Her cousin, who she lives with, states that they have her medications at home and that the only things she is out of is Valium.

## 2015-01-06 NOTE — ED Notes (Signed)
Pt seen here yesterday and discharged; here for suicidal ideation; pt denies suicidal thoughts at this time; pt says she was told to follow up with [facility] in a month; pt also says she is [facility]; here with family member who is being treated and thought she'd check in to see if she could get refill prescriptions for her medications; has been out for 2 days and feels like she is going through withdrawal; pt takes Zoloft, Diazepam, Seroquel, Clonazepam and Mirtazapine; pt denies suicidal thoughts at this time

## 2015-01-06 NOTE — ED Notes (Signed)
Seen by Social work.

## 2017-07-03 IMAGING — CT CT RENAL STONE PROTOCOL
1 of 2 series · 4 of 32 positions shown, 9 images · non-contrast
Comparison: None.

CLINICAL DATA: Low back pain for 3 days, left flank pain

EXAM:
CT ABDOMEN AND PELVIS WITHOUT CONTRAST
TECHNIQUE: Multidetector CT imaging of the abdomen and pelvis was performed
following the standard protocol without IV contrast.

[Series 4: lung windows · axial · 0.65mm/px · z∈[-136,-76]mm · 4 of 21 slices shown, 9 images]
[im 5/21  soft-tissue]
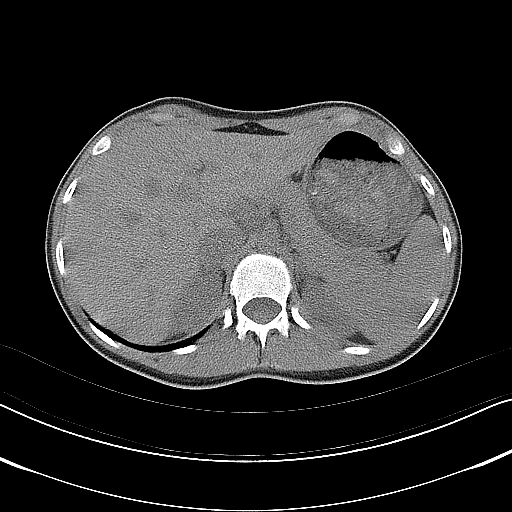
[im 5/21  lung]
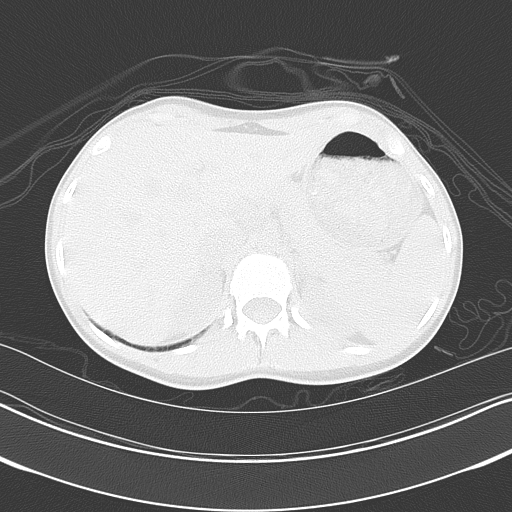
[im 5/21  bone]
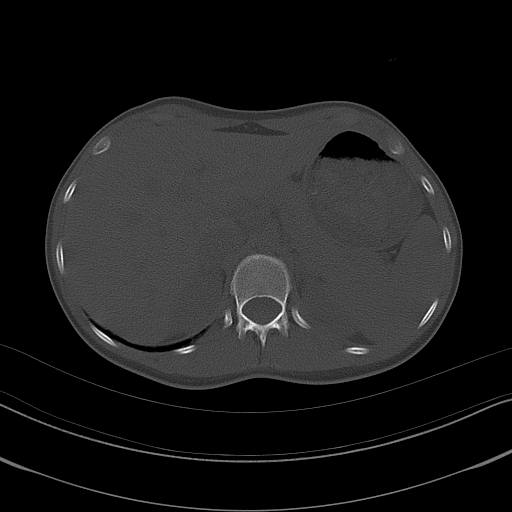
[im 9/21  soft-tissue]
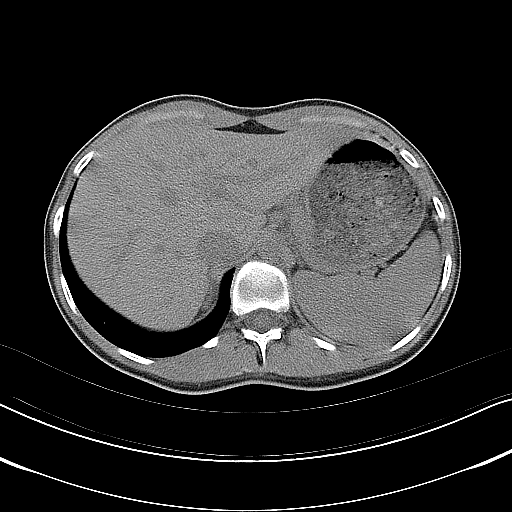
[im 9/21  lung]
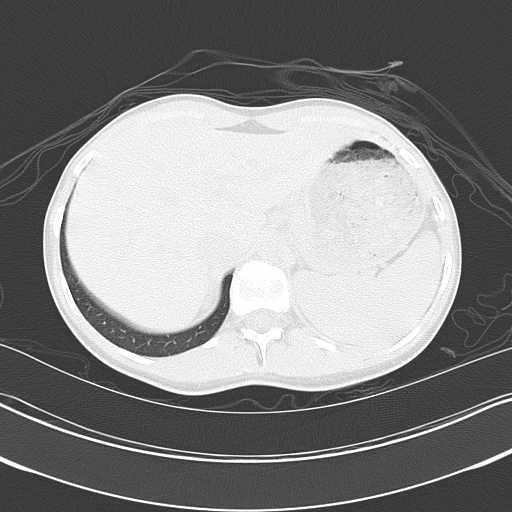
[im 13/21  soft-tissue]
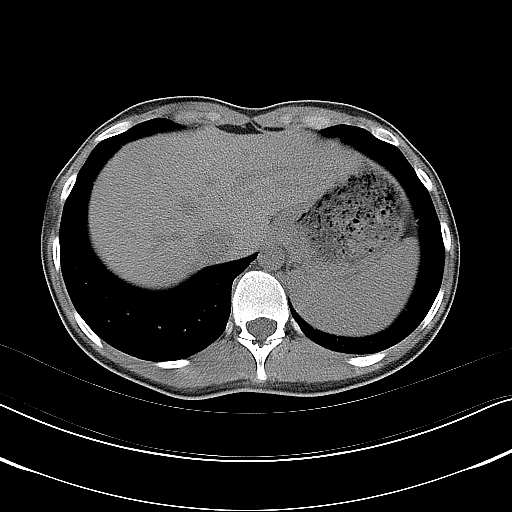
[im 13/21  lung]
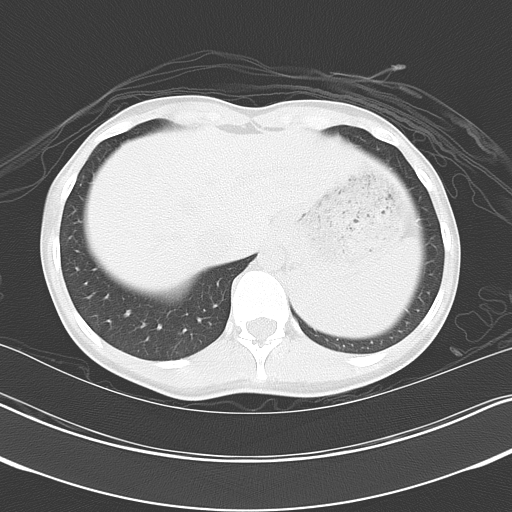
[im 17/21  soft-tissue]
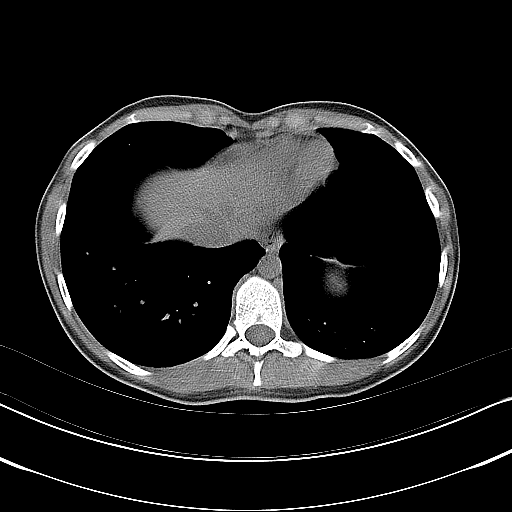
[im 17/21  lung]
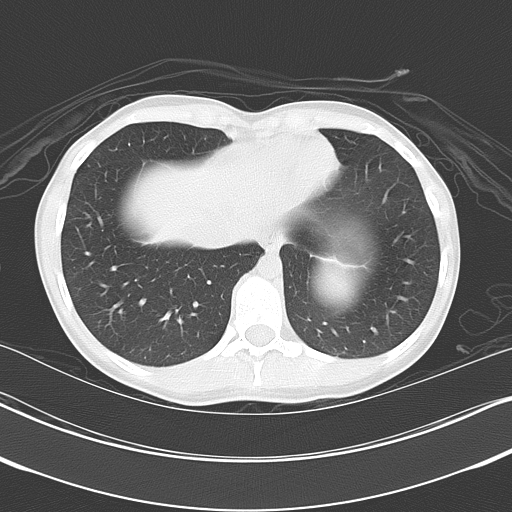

[4 of 32 positions shown; findings below may reference images not displayed]

FINDINGS: Lung bases are unremarkable. Sagittal images of the spine are
unremarkable. Unenhanced liver shows no biliary ductal dilatation.
No calcified gallstones are noted within gallbladder. No aortic
aneurysm. Unenhanced pancreas, spleen and adrenal glands are
unremarkable. Unenhanced kidneys are symmetrical in size. No
nephrolithiasis. No hydronephrosis or hydroureter. Moderate stool
noted throughout the colon. Normal appendix noted in axial image 52.
No pericecal inflammation. There is abundant stool in sigmoid colon.
Moderate stool noted spasm moderate stool and gas noted distal
sigmoid colon and rectum. A vaginal tampon is noted. Unenhanced
uterus is unremarkable. No calcified calculi are noted within under
distended urinary bladder.

No small bowel obstruction.  No ascites or free air.  No adenopathy.
IMPRESSION: 1. There is no nephrolithiasis.  No hydronephrosis or hydroureter.
2. No calcified ureteral calculi are noted.
3. Normal appendix. No pericecal inflammation. Abundant stool noted
in sigmoid colon. Moderate stool noted in distal sigmoid colon and
rectum.
4. No small bowel obstruction.
